# Patient Record
Sex: Male | Born: 1960
Health system: Southern US, Community
[De-identification: ages and names within clinical notes are randomized; demographics above are authoritative.]

---

## 2000-10-13 ENCOUNTER — Ambulatory Visit (HOSPITAL_COMMUNITY): Admission: RE | Admit: 2000-10-13 | Discharge: 2000-10-13 | Payer: Self-pay | Admitting: Gastroenterology

## 2003-06-10 ENCOUNTER — Ambulatory Visit (HOSPITAL_COMMUNITY): Admission: RE | Admit: 2003-06-10 | Discharge: 2003-06-10 | Payer: Self-pay | Admitting: Neurology

## 2003-06-10 ENCOUNTER — Encounter: Payer: Self-pay | Admitting: Neurology

## 2004-03-19 ENCOUNTER — Ambulatory Visit (HOSPITAL_COMMUNITY): Admission: RE | Admit: 2004-03-19 | Discharge: 2004-03-19 | Payer: Self-pay | Admitting: Neurology

## 2014-01-08 ENCOUNTER — Telehealth: Payer: Self-pay | Admitting: Internal Medicine

## 2014-01-08 NOTE — Telephone Encounter (Signed)
Received request from Nurse fax box, documents faxed for surgical clearance. To: [facility]  Fax number: [phone] Attention: [id number]/[id number]

## 2016-11-11 DIAGNOSIS — K5730 Diverticulosis of large intestine without perforation or abscess without bleeding: Secondary | ICD-10-CM | POA: Diagnosis not present

## 2016-11-11 DIAGNOSIS — I444 Left anterior fascicular block: Secondary | ICD-10-CM | POA: Diagnosis not present

## 2016-11-11 DIAGNOSIS — Z658 Other specified problems related to psychosocial circumstances: Secondary | ICD-10-CM | POA: Diagnosis not present

## 2016-11-11 DIAGNOSIS — Z0000 Encounter for general adult medical examination without abnormal findings: Secondary | ICD-10-CM | POA: Diagnosis not present

## 2016-11-11 DIAGNOSIS — E785 Hyperlipidemia, unspecified: Secondary | ICD-10-CM | POA: Diagnosis not present

## 2016-11-11 DIAGNOSIS — R972 Elevated prostate specific antigen [PSA]: Secondary | ICD-10-CM | POA: Diagnosis not present

## 2016-11-11 MED FILL — PROPRANOLOL 20 MG TABLET: 20 | 30 days supply | Qty: 30 | Fill #0

## 2016-11-11 MED FILL — ROSUVASTATIN CALCIUM 10 MG: 10 | 90 days supply | Qty: 90 | Fill #0 | Status: TO

## 2016-11-23 DIAGNOSIS — H5213 Myopia, bilateral: Secondary | ICD-10-CM | POA: Diagnosis not present

## 2016-12-31 DIAGNOSIS — Z0000 Encounter for general adult medical examination without abnormal findings: Secondary | ICD-10-CM | POA: Diagnosis not present

## 2017-08-15 MED FILL — ROSUVASTATIN CALCIUM 10 MG: 10 | 90 days supply | Qty: 90 | Fill #0

## 2018-02-20 DIAGNOSIS — R972 Elevated prostate specific antigen [PSA]: Secondary | ICD-10-CM | POA: Diagnosis not present

## 2018-02-20 DIAGNOSIS — Z0000 Encounter for general adult medical examination without abnormal findings: Secondary | ICD-10-CM | POA: Diagnosis not present

## 2018-02-20 DIAGNOSIS — Z658 Other specified problems related to psychosocial circumstances: Secondary | ICD-10-CM | POA: Diagnosis not present

## 2018-02-20 DIAGNOSIS — E785 Hyperlipidemia, unspecified: Secondary | ICD-10-CM | POA: Diagnosis not present

## 2018-02-20 DIAGNOSIS — I444 Left anterior fascicular block: Secondary | ICD-10-CM | POA: Diagnosis not present

## 2018-02-20 DIAGNOSIS — K5730 Diverticulosis of large intestine without perforation or abscess without bleeding: Secondary | ICD-10-CM | POA: Diagnosis not present

## 2018-02-20 MED FILL — ROSUVASTATIN CALCIUM 10 MG: 10 | 84 days supply | Qty: 36 | Fill #0

## 2018-02-20 MED FILL — PROPRANOLOL 20 MG TABLET: 20 | 30 days supply | Qty: 30 | Fill #0

## 2018-03-01 MED FILL — predniSONE 20 MG TABS: 20 | 6 days supply | Qty: 12 | Fill #0

## 2018-03-01 MED FILL — CYCLOBENZAPRINE 10 MG TAB: 10 | 5 days supply | Qty: 5 | Fill #0

## 2018-06-26 MED FILL — ROSUVASTATIN CALCIUM 10 MG: 10 | 84 days supply | Qty: 36 | Fill #1

## 2018-06-29 DIAGNOSIS — H52203 Unspecified astigmatism, bilateral: Secondary | ICD-10-CM | POA: Diagnosis not present

## 2018-06-29 DIAGNOSIS — H5213 Myopia, bilateral: Secondary | ICD-10-CM | POA: Diagnosis not present

## 2018-10-13 MED FILL — ROSUVASTATIN CALCIUM 10 MG: 10 | 84 days supply | Qty: 36 | Fill #2

## 2018-12-08 MED FILL — OSELTAMIVIR PHOSPHATE 75 MG: 75 | 5 days supply | Qty: 10 | Fill #0

## 2019-01-22 MED FILL — ROSUVASTATIN CALCIUM 10 MG: 10 | 84 days supply | Qty: 36 | Fill #3

## 2019-03-06 DIAGNOSIS — E785 Hyperlipidemia, unspecified: Secondary | ICD-10-CM | POA: Diagnosis not present

## 2019-03-06 DIAGNOSIS — Z0000 Encounter for general adult medical examination without abnormal findings: Secondary | ICD-10-CM | POA: Diagnosis not present

## 2019-03-06 DIAGNOSIS — K5730 Diverticulosis of large intestine without perforation or abscess without bleeding: Secondary | ICD-10-CM | POA: Diagnosis not present

## 2019-03-06 DIAGNOSIS — I444 Left anterior fascicular block: Secondary | ICD-10-CM | POA: Diagnosis not present

## 2019-03-06 DIAGNOSIS — R972 Elevated prostate specific antigen [PSA]: Secondary | ICD-10-CM | POA: Diagnosis not present

## 2019-03-06 DIAGNOSIS — Z658 Other specified problems related to psychosocial circumstances: Secondary | ICD-10-CM | POA: Diagnosis not present

## 2019-04-06 DIAGNOSIS — K5730 Diverticulosis of large intestine without perforation or abscess without bleeding: Secondary | ICD-10-CM | POA: Diagnosis not present

## 2019-04-06 DIAGNOSIS — R972 Elevated prostate specific antigen [PSA]: Secondary | ICD-10-CM | POA: Diagnosis not present

## 2019-04-06 DIAGNOSIS — E785 Hyperlipidemia, unspecified: Secondary | ICD-10-CM | POA: Diagnosis not present

## 2019-04-06 DIAGNOSIS — E559 Vitamin D deficiency, unspecified: Secondary | ICD-10-CM | POA: Diagnosis not present

## 2019-04-26 DIAGNOSIS — N401 Enlarged prostate with lower urinary tract symptoms: Secondary | ICD-10-CM | POA: Diagnosis not present

## 2019-04-26 DIAGNOSIS — R972 Elevated prostate specific antigen [PSA]: Secondary | ICD-10-CM | POA: Diagnosis not present

## 2019-04-26 DIAGNOSIS — R350 Frequency of micturition: Secondary | ICD-10-CM | POA: Diagnosis not present

## 2019-04-30 ENCOUNTER — Other Ambulatory Visit: Payer: Self-pay | Admitting: Urology

## 2019-04-30 DIAGNOSIS — R972 Elevated prostate specific antigen [PSA]: Secondary | ICD-10-CM

## 2019-05-10 MED FILL — ROSUVASTATIN CALCIUM 10 MG: 10 | 84 days supply | Qty: 36 | Fill #0

## 2019-06-01 ENCOUNTER — Ambulatory Visit
Admission: RE | Admit: 2019-06-01 | Discharge: 2019-06-01 | Disposition: A | Discharge status: Home / Self Care | Payer: 59 | Source: Ambulatory Visit | Attending: Urology | Admitting: Urology

## 2019-06-01 ENCOUNTER — Other Ambulatory Visit: Payer: Self-pay

## 2019-06-01 DIAGNOSIS — N400 Enlarged prostate without lower urinary tract symptoms: Secondary | ICD-10-CM | POA: Diagnosis not present

## 2019-06-01 DIAGNOSIS — R972 Elevated prostate specific antigen [PSA]: Secondary | ICD-10-CM

## 2019-06-01 MED ORDER — GADOBENATE DIMEGLUMINE 529 MG/ML IV SOLN
19.0000 mL | Freq: Once | INTRAVENOUS | Status: AC | PRN
  Administered 2019-06-01: 19 mL via INTRAVENOUS

## 2019-08-06 MED FILL — ROSUVASTATIN CALCIUM 10 MG: 10 | 84 days supply | Qty: 36 | Fill #1

## 2019-08-06 MED FILL — PROPRANOLOL 20 MG TABLET: 20 | 30 days supply | Qty: 30 | Fill #0

## 2019-10-23 ENCOUNTER — Other Ambulatory Visit: Payer: Self-pay | Admitting: Gastroenterology

## 2019-10-23 DIAGNOSIS — R634 Abnormal weight loss: Secondary | ICD-10-CM | POA: Diagnosis not present

## 2019-10-23 DIAGNOSIS — R1084 Generalized abdominal pain: Secondary | ICD-10-CM

## 2019-10-26 ENCOUNTER — Other Ambulatory Visit: Payer: Self-pay

## 2019-10-26 ENCOUNTER — Ambulatory Visit
Admission: RE | Admit: 2019-10-26 | Discharge: 2019-10-26 | Disposition: A | Discharge status: Home / Self Care | Payer: 59 | Source: Ambulatory Visit | Attending: Gastroenterology | Admitting: Gastroenterology

## 2019-10-26 DIAGNOSIS — R634 Abnormal weight loss: Secondary | ICD-10-CM

## 2019-10-26 DIAGNOSIS — N289 Disorder of kidney and ureter, unspecified: Secondary | ICD-10-CM | POA: Diagnosis not present

## 2019-10-26 DIAGNOSIS — R1084 Generalized abdominal pain: Secondary | ICD-10-CM

## 2019-10-26 MED ORDER — IOPAMIDOL (ISOVUE-300) INJECTION 61%
100.0000 mL | Freq: Once | INTRAVENOUS | Status: AC | PRN
  Administered 2019-10-26: 100 mL via INTRAVENOUS

## 2019-10-29 MED FILL — OSCIMIN SR 0.375 MG TABLET: 0.375 | 10 days supply | Qty: 20 | Fill #0

## 2019-10-29 MED FILL — ROSUVASTATIN CALCIUM 10 MG: 10 | 84 days supply | Qty: 36 | Fill #2

## 2019-11-21 MED FILL — ROSUVASTATIN CALCIUM 10 MG: 10 | 84 days supply | Qty: 36 | Fill #2

## 2019-11-21 MED FILL — OSCIMIN SR 0.375 MG TABLET: 0.375 | 10 days supply | Qty: 20 | Fill #0

## 2020-03-06 DIAGNOSIS — E785 Hyperlipidemia, unspecified: Secondary | ICD-10-CM | POA: Diagnosis not present

## 2020-03-06 DIAGNOSIS — R634 Abnormal weight loss: Secondary | ICD-10-CM | POA: Diagnosis not present

## 2020-03-06 DIAGNOSIS — Z0000 Encounter for general adult medical examination without abnormal findings: Secondary | ICD-10-CM | POA: Diagnosis not present

## 2020-03-06 DIAGNOSIS — R1084 Generalized abdominal pain: Secondary | ICD-10-CM | POA: Diagnosis not present

## 2020-03-06 DIAGNOSIS — I444 Left anterior fascicular block: Secondary | ICD-10-CM | POA: Diagnosis not present

## 2020-03-06 DIAGNOSIS — Z658 Other specified problems related to psychosocial circumstances: Secondary | ICD-10-CM | POA: Diagnosis not present

## 2020-03-06 DIAGNOSIS — K5730 Diverticulosis of large intestine without perforation or abscess without bleeding: Secondary | ICD-10-CM | POA: Diagnosis not present

## 2020-03-06 DIAGNOSIS — R972 Elevated prostate specific antigen [PSA]: Secondary | ICD-10-CM | POA: Diagnosis not present

## 2020-04-25 ENCOUNTER — Other Ambulatory Visit (HOSPITAL_COMMUNITY): Payer: Self-pay | Admitting: Internal Medicine

## 2020-04-25 MED FILL — ROSUVASTATIN CALCIUM 5 MG T: 5 | 84 days supply | Qty: 36 | Fill #0

## 2020-07-03 DIAGNOSIS — H5213 Myopia, bilateral: Secondary | ICD-10-CM | POA: Diagnosis not present

## 2020-07-03 MED FILL — ROSUVASTATIN CALCIUM 5 MG T: 5 | 84 days supply | Qty: 36 | Fill #1

## 2020-09-01 ENCOUNTER — Other Ambulatory Visit (HOSPITAL_COMMUNITY): Payer: Self-pay | Admitting: Internal Medicine

## 2020-09-01 MED FILL — PROPRANOLOL 20 MG TABLET: 20 | 90 days supply | Qty: 90 | Fill #0

## 2020-09-08 DIAGNOSIS — Z20822 Contact with and (suspected) exposure to covid-19: Secondary | ICD-10-CM | POA: Diagnosis not present

## 2020-10-03 MED FILL — PROPRANOLOL 20 MG TABLET: 20 | 90 days supply | Qty: 90 | Fill #0

## 2020-10-03 MED FILL — ROSUVASTATIN CALCIUM 5 MG T: 5 | 84 days supply | Qty: 36 | Fill #2

## 2021-02-09 ENCOUNTER — Other Ambulatory Visit (HOSPITAL_COMMUNITY): Payer: Self-pay

## 2021-02-11 ENCOUNTER — Other Ambulatory Visit (HOSPITAL_COMMUNITY): Payer: Self-pay

## 2021-02-11 MED ORDER — SCOPOLAMINE 1 MG/3DAYS TD PT72
1.5000 mg | MEDICATED_PATCH | TRANSDERMAL | 0 refills | Status: AC
  Filled 2021-02-11: qty 10, 30d supply, fill #0

## 2021-02-11 MED FILL — Rosuvastatin Calcium Tab 5 MG: ORAL | 87 days supply | Qty: 37 | Fill #0 | Status: AC

## 2021-04-02 ENCOUNTER — Other Ambulatory Visit (HOSPITAL_COMMUNITY): Payer: Self-pay

## 2021-04-02 ENCOUNTER — Other Ambulatory Visit: Payer: Self-pay | Admitting: Internal Medicine

## 2021-04-02 DIAGNOSIS — Z136 Encounter for screening for cardiovascular disorders: Secondary | ICD-10-CM

## 2021-04-02 MED ORDER — ROSUVASTATIN CALCIUM 5 MG PO TABS
5.0000 mg | ORAL_TABLET | ORAL | 3 refills | Status: DC
  Filled 2021-04-02: qty 13, 31d supply, fill #0
  Filled 2021-05-13: qty 13, 31d supply, fill #1
  Filled 2021-06-10: qty 13, 31d supply, fill #2
  Filled 2021-07-16: qty 13, 31d supply, fill #3
  Filled 2021-09-14: qty 13, 31d supply, fill #4
  Filled 2021-10-13: qty 13, 31d supply, fill #5
  Filled 2021-11-12: qty 13, 31d supply, fill #6
  Filled 2021-12-14: qty 13, 31d supply, fill #7
  Filled 2022-01-13: qty 13, 31d supply, fill #8
  Filled 2022-02-10: qty 13, 31d supply, fill #9
  Filled 2022-03-12: qty 13, 31d supply, fill #10

## 2021-04-02 MED ORDER — VALSARTAN-HYDROCHLOROTHIAZIDE 80-12.5 MG PO TABS
1.0000 | ORAL_TABLET | Freq: Every day | ORAL | 3 refills | Status: DC
  Filled 2021-04-02: qty 30, 30d supply, fill #0
  Filled 2021-05-09: qty 30, 30d supply, fill #1
  Filled 2021-06-10: qty 30, 30d supply, fill #2
  Filled 2021-07-16: qty 30, 30d supply, fill #3
  Filled 2021-08-18: qty 30, 30d supply, fill #4
  Filled 2021-09-14: qty 30, 30d supply, fill #5
  Filled 2021-10-13: qty 30, 30d supply, fill #6
  Filled 2021-11-12: qty 30, 30d supply, fill #7
  Filled 2021-12-14: qty 30, 30d supply, fill #8
  Filled 2022-01-13: qty 30, 30d supply, fill #9
  Filled 2022-02-10: qty 30, 30d supply, fill #10
  Filled 2022-03-12: qty 30, 30d supply, fill #11

## 2021-04-03 ENCOUNTER — Other Ambulatory Visit (HOSPITAL_COMMUNITY): Payer: Self-pay

## 2021-04-30 ENCOUNTER — Other Ambulatory Visit: Payer: Self-pay

## 2021-04-30 ENCOUNTER — Ambulatory Visit
Admission: RE | Admit: 2021-04-30 | Discharge: 2021-04-30 | Disposition: A | Discharge status: Home / Self Care | Payer: No Typology Code available for payment source | Source: Ambulatory Visit | Attending: Internal Medicine | Admitting: Internal Medicine

## 2021-04-30 DIAGNOSIS — Z136 Encounter for screening for cardiovascular disorders: Secondary | ICD-10-CM

## 2021-05-12 ENCOUNTER — Other Ambulatory Visit (HOSPITAL_COMMUNITY): Payer: Self-pay

## 2021-05-13 ENCOUNTER — Other Ambulatory Visit (HOSPITAL_COMMUNITY): Payer: Self-pay

## 2021-06-10 ENCOUNTER — Other Ambulatory Visit (HOSPITAL_COMMUNITY): Payer: Self-pay

## 2021-07-16 ENCOUNTER — Other Ambulatory Visit (HOSPITAL_COMMUNITY): Payer: Self-pay

## 2021-08-18 ENCOUNTER — Other Ambulatory Visit (HOSPITAL_COMMUNITY): Payer: Self-pay

## 2021-08-31 IMAGING — CT CT ABD-PELV W/ CM
2 of 5 series · 13 of 46 positions shown, 15 images · IV contrast (iopamidol)
Comparison: None.

CLINICAL DATA: Loss of weight, diarrhea and pain

EXAM:
CT ABDOMEN AND PELVIS WITH CONTRAST
TECHNIQUE: Multidetector CT imaging of the abdomen and pelvis was performed
using the standard protocol following bolus administration of
intravenous contrast.
CONTRAST:  100mL 9XJNIC-[id number] IOPAMIDOL ([id number]-[id number]) INJECTION 61%

[Series 2: abd pelvis 5.00 br40 s3 axial · axial · 0.62mm/px · z∈[+1113,+1628]mm · 10 of 115 slices shown, 12 images]
[im 6/115  soft-tissue]
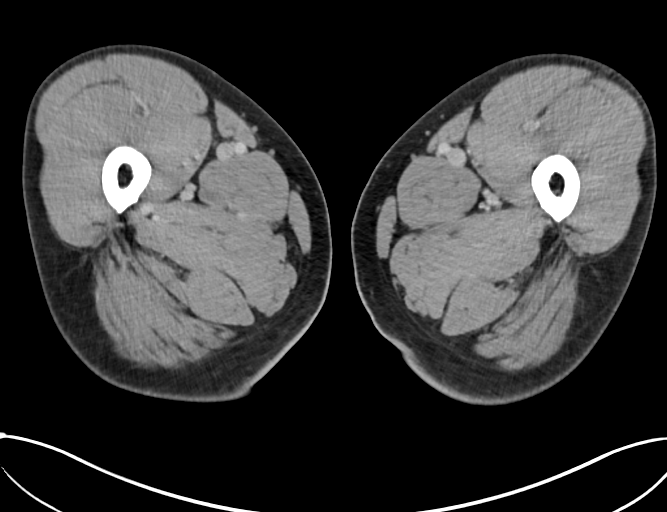
[im 6/115  bone]
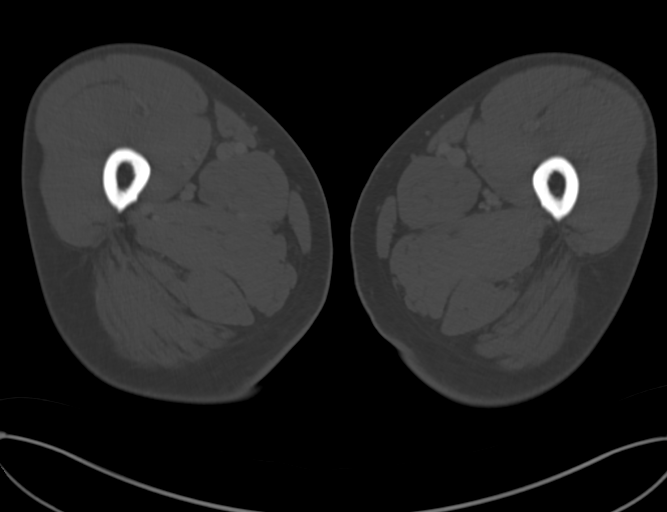
[im 18/115  soft-tissue]
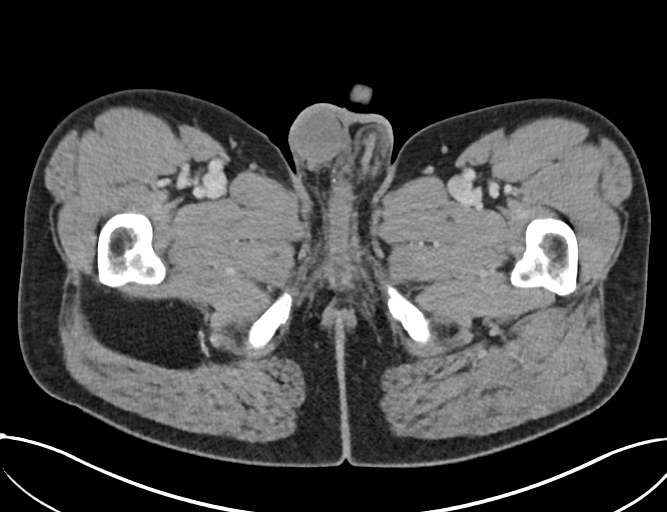
[im 29/115  soft-tissue]
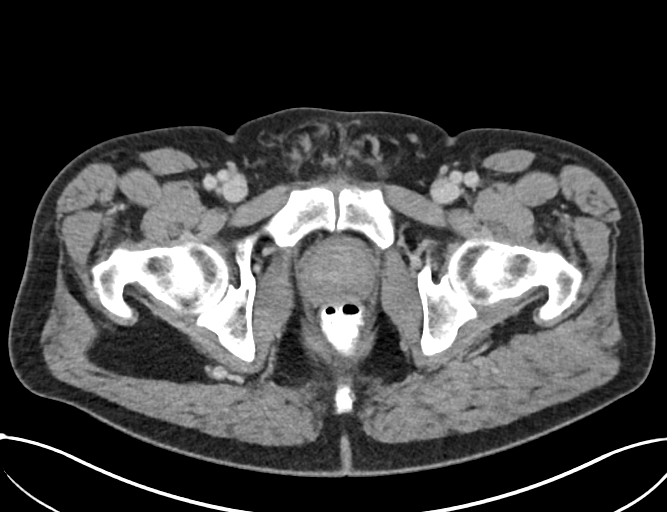
[im 40/115  soft-tissue]
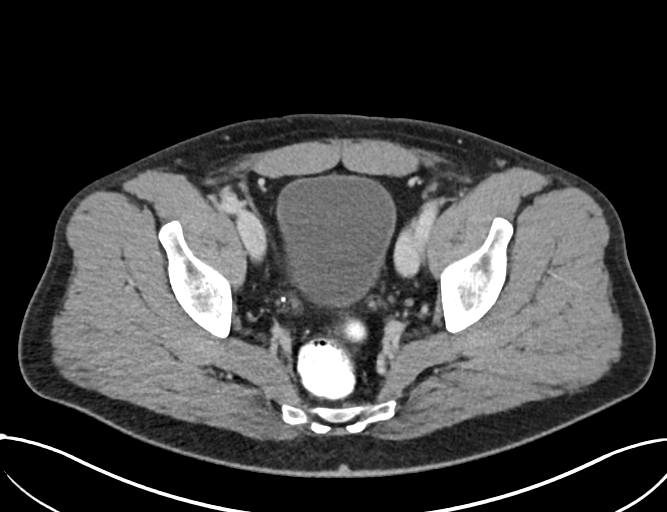
[im 52/115  soft-tissue]
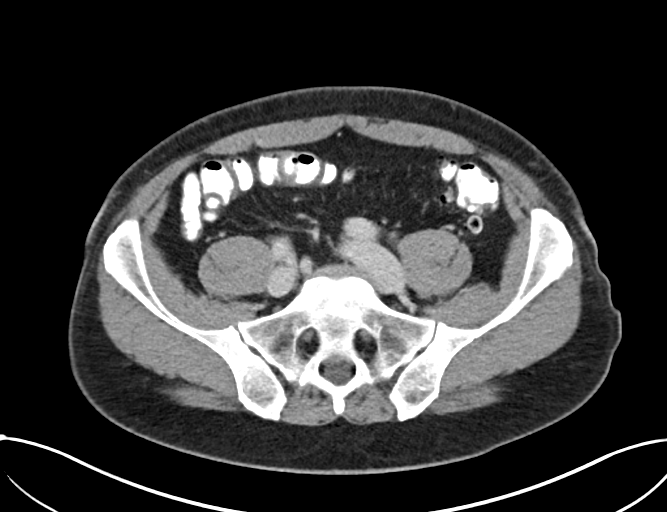
[im 63/115  soft-tissue]
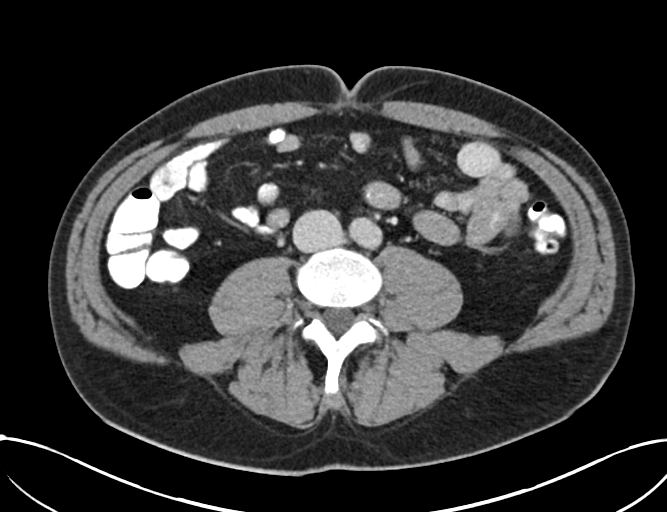
[im 75/115  soft-tissue]
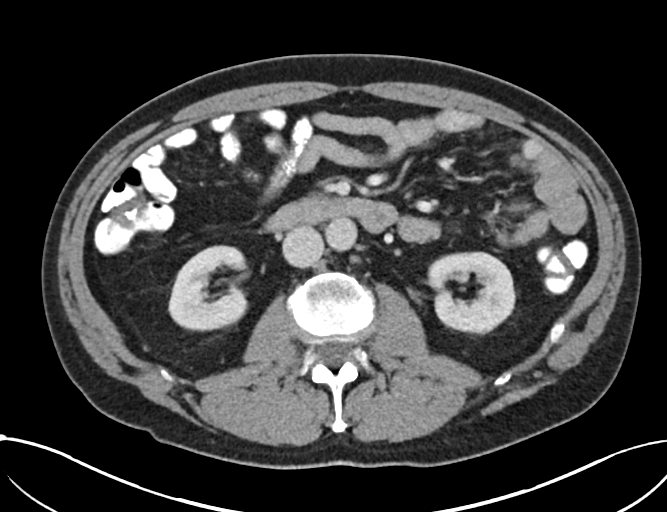
[im 86/115  soft-tissue]
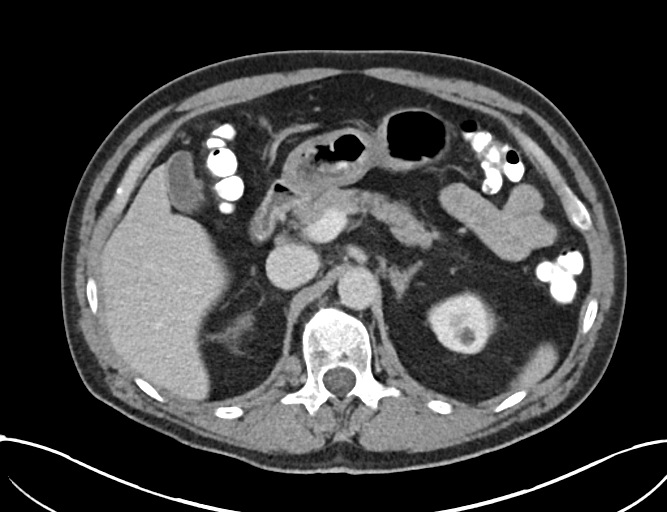
[im 97/115  soft-tissue]
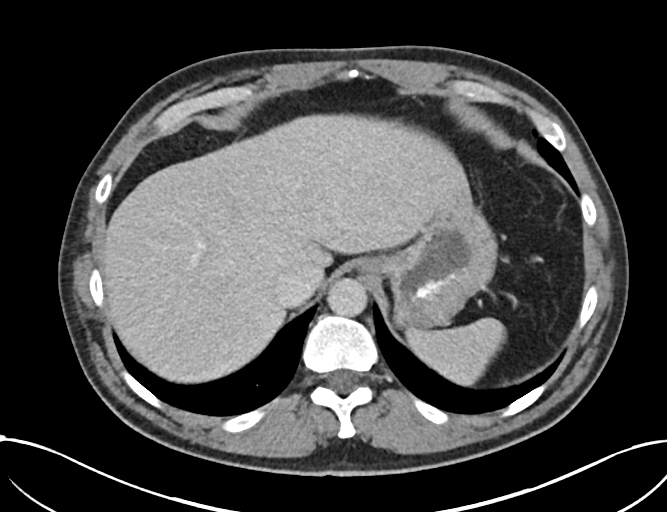
[im 97/115  bone]
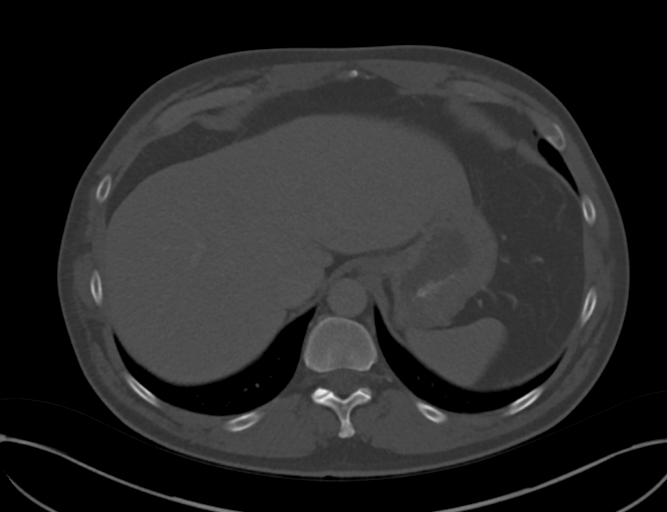
[im 109/115  soft-tissue]
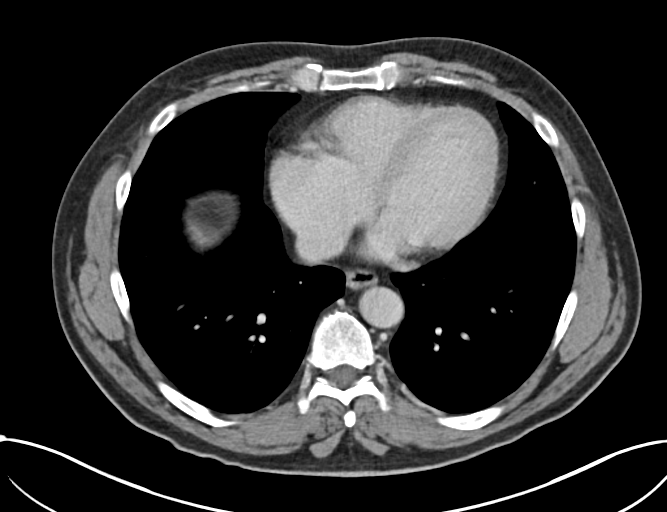

[Series 6: abd pelvis 2.00 br40 s3 cor · coronal · 0.81mm/px · 3 of 159 slices shown]
[im 53/159  soft-tissue]
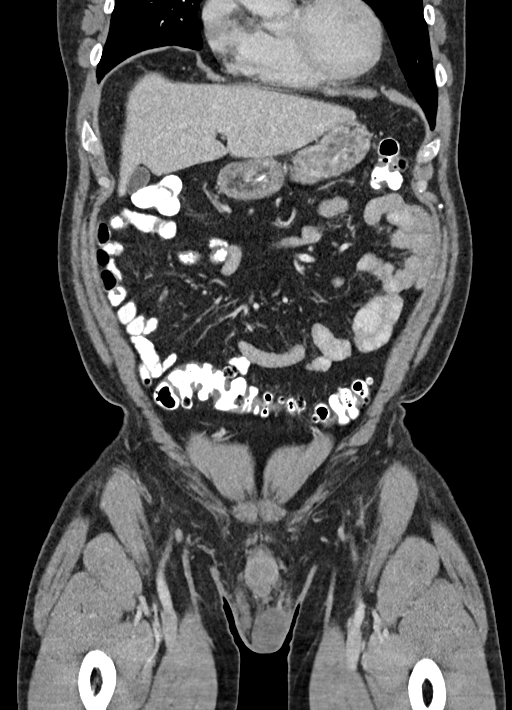
[im 71/159  soft-tissue]
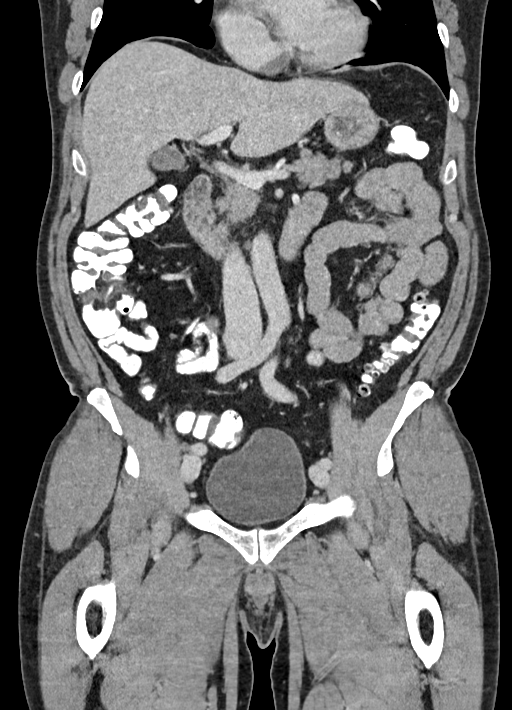
[im 88/159  soft-tissue]
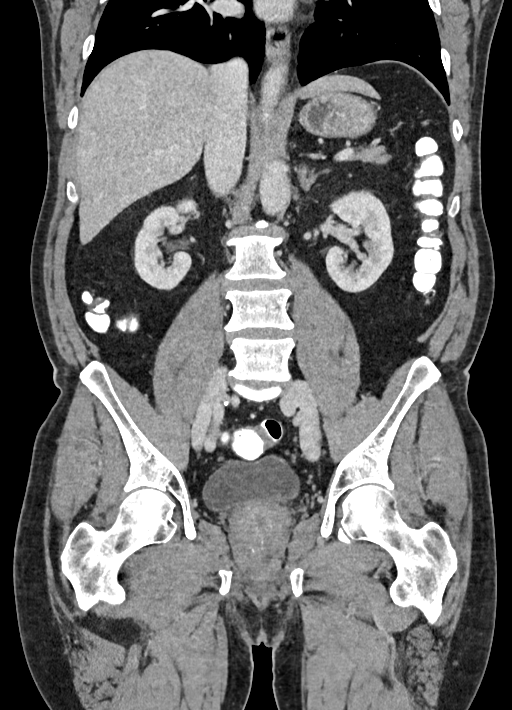

[13 of 46 positions shown; findings below may reference images not displayed]

FINDINGS: Lower chest: No acute abnormality.

Hepatobiliary: No focal liver abnormality is seen. No gallstones,
gallbladder wall thickening, or biliary dilatation.

Pancreas: Unremarkable. No pancreatic ductal dilatation or
surrounding inflammatory changes.

Spleen: Normal in size without focal abnormality.

Adrenals/Urinary Tract: Normal appearance of the adrenal glands.
Left upper pole low-attenuation structure measures 0.8 cm, image
[DATE]. This is too small to reliably characterize. No hydronephrosis
identified bilaterally. Urinary bladder is unremarkable.

Stomach/Bowel: Stomach is normal. There is no bowel wall thickening,
inflammation or distension. The appendix is visualized and appears
normal. Scattered colonic diverticula noted. No acute inflammation
identified however

Vascular/Lymphatic: No significant vascular findings are present. No
enlarged abdominal or pelvic lymph nodes.

Reproductive: Prostate measures 4.1 x 4.8 by 4.7 cm.

Other: No abdominal wall hernia or abnormality. No abdominopelvic
ascites.

Musculoskeletal: No acute or suspicious osseous findings. There is
degenerative disc disease identified at L1-2 and L2-3 with disc
space narrowing and endplate spurring.
IMPRESSION: 1. No acute findings and no explanation for patient's pain and loss
of weight.
2. Small left upper pole kidney lesion measures 8 mm and is too
small to characterize.
3. Lumbar degenerative disc disease.

## 2021-09-14 ENCOUNTER — Other Ambulatory Visit (HOSPITAL_COMMUNITY): Payer: Self-pay

## 2021-10-13 ENCOUNTER — Other Ambulatory Visit (HOSPITAL_COMMUNITY): Payer: Self-pay

## 2021-11-12 ENCOUNTER — Other Ambulatory Visit (HOSPITAL_COMMUNITY): Payer: Self-pay

## 2021-12-14 ENCOUNTER — Other Ambulatory Visit (HOSPITAL_COMMUNITY): Payer: Self-pay

## 2021-12-14 MED ORDER — PEG 3350-KCL-NA BICARB-NACL 420 G PO SOLR
ORAL | 0 refills | Status: AC
  Filled 2021-12-14: qty 4000, 1d supply, fill #0

## 2022-01-13 ENCOUNTER — Other Ambulatory Visit (HOSPITAL_COMMUNITY): Payer: Self-pay

## 2022-02-10 ENCOUNTER — Other Ambulatory Visit (HOSPITAL_COMMUNITY): Payer: Self-pay

## 2022-02-11 ENCOUNTER — Other Ambulatory Visit (HOSPITAL_COMMUNITY): Payer: Self-pay

## 2022-03-12 ENCOUNTER — Other Ambulatory Visit (HOSPITAL_COMMUNITY): Payer: Self-pay

## 2022-04-09 ENCOUNTER — Other Ambulatory Visit (HOSPITAL_COMMUNITY): Payer: Self-pay

## 2022-04-09 MED ORDER — ROSUVASTATIN CALCIUM 5 MG PO TABS
5.0000 mg | ORAL_TABLET | ORAL | 3 refills | Status: AC
  Filled 2022-04-09: qty 13, 31d supply, fill #0
  Filled 2022-07-14: qty 13, 31d supply, fill #1
  Filled 2022-09-15: qty 13, 31d supply, fill #2
  Filled 2022-11-06: qty 13, 31d supply, fill #3
  Filled 2022-11-28 – 2022-12-25 (×2): qty 13, 31d supply, fill #4
  Filled 2023-02-01: qty 13, 31d supply, fill #5

## 2022-04-09 MED ORDER — VALSARTAN-HYDROCHLOROTHIAZIDE 80-12.5 MG PO TABS
1.0000 | ORAL_TABLET | Freq: Every day | ORAL | 3 refills | Status: AC
  Filled 2022-04-09: qty 30, 30d supply, fill #0
  Filled 2022-05-11: qty 30, 30d supply, fill #1
  Filled 2022-06-16: qty 30, 30d supply, fill #2
  Filled 2022-07-14: qty 30, 30d supply, fill #3
  Filled 2022-08-15: qty 30, 30d supply, fill #4
  Filled 2022-09-15: qty 30, 30d supply, fill #5
  Filled 2022-10-12: qty 30, 30d supply, fill #6

## 2022-04-09 MED ORDER — ROSUVASTATIN CALCIUM 5 MG PO TABS
5.0000 mg | ORAL_TABLET | ORAL | 3 refills | Status: DC
  Filled 2022-05-11: qty 13, 30d supply, fill #0
  Filled 2022-06-16: qty 13, 30d supply, fill #1
  Filled 2022-08-15: qty 13, 30d supply, fill #2
  Filled 2022-10-12: qty 13, 30d supply, fill #3
  Filled 2022-11-30: qty 13, 30d supply, fill #4
  Filled 2023-03-07: qty 13, 30d supply, fill #5
  Filled 2023-04-01: qty 13, 30d supply, fill #6

## 2022-05-12 ENCOUNTER — Other Ambulatory Visit (HOSPITAL_COMMUNITY): Payer: Self-pay

## 2022-06-16 ENCOUNTER — Other Ambulatory Visit (HOSPITAL_COMMUNITY): Payer: Self-pay

## 2022-06-17 ENCOUNTER — Other Ambulatory Visit (HOSPITAL_COMMUNITY): Payer: Self-pay

## 2022-06-17 MED ORDER — PROPRANOLOL HCL 20 MG PO TABS
20.0000 mg | ORAL_TABLET | Freq: Every day | ORAL | 1 refills | Status: AC
  Filled 2022-06-17: qty 30, 30d supply, fill #0

## 2022-06-22 ENCOUNTER — Other Ambulatory Visit (HOSPITAL_COMMUNITY): Payer: Self-pay

## 2022-07-15 ENCOUNTER — Other Ambulatory Visit (HOSPITAL_COMMUNITY): Payer: Self-pay

## 2022-08-16 ENCOUNTER — Other Ambulatory Visit (HOSPITAL_COMMUNITY): Payer: Self-pay

## 2022-09-15 ENCOUNTER — Other Ambulatory Visit (HOSPITAL_COMMUNITY): Payer: Self-pay

## 2022-09-24 ENCOUNTER — Other Ambulatory Visit (HOSPITAL_COMMUNITY): Payer: Self-pay

## 2022-09-24 MED ORDER — ALFUZOSIN HCL ER 10 MG PO TB24
10.0000 mg | ORAL_TABLET | Freq: Every day | ORAL | 3 refills | Status: AC
  Filled 2022-09-24: qty 30, 30d supply, fill #0
  Filled 2022-10-24: qty 30, 30d supply, fill #1
  Filled 2022-11-28: qty 30, 30d supply, fill #2
  Filled 2022-12-25 – 2022-12-27 (×2): qty 30, 30d supply, fill #3
  Filled 2023-02-01: qty 30, 30d supply, fill #4
  Filled 2023-03-07: qty 30, 30d supply, fill #5
  Filled 2023-04-01: qty 30, 30d supply, fill #6

## 2022-10-12 ENCOUNTER — Other Ambulatory Visit (HOSPITAL_COMMUNITY): Payer: Self-pay

## 2022-11-29 ENCOUNTER — Other Ambulatory Visit (HOSPITAL_COMMUNITY): Payer: Self-pay

## 2022-11-30 ENCOUNTER — Other Ambulatory Visit: Payer: Self-pay

## 2022-11-30 ENCOUNTER — Other Ambulatory Visit (HOSPITAL_COMMUNITY): Payer: Self-pay

## 2022-12-27 ENCOUNTER — Other Ambulatory Visit (HOSPITAL_COMMUNITY): Payer: Self-pay

## 2023-03-06 IMAGING — CT CT CARDIAC CORONARY ARTERY CALCIUM SCORE
3 series · 14 of 20 positions shown, 16 images · non-contrast
Comparison: None.

CLINICAL DATA: 60-year-old Caucasian male with borderline
hypercholesterolemia.

EXAM:
CT CARDIAC CORONARY ARTERY CALCIUM SCORE
TECHNIQUE: Non-contrast imaging through the heart was performed using
prospective ECG gating. Image post processing was performed on an
independent workstation, allowing for quantitative analysis of the
heart and coronary arteries. Note that this exam targets the heart
and the chest was not imaged in its entirety.

[Series 2: calcium scoring 2.00 qr36 bestdiast 71% hrt calciu · axial · 0.43mm/px · z∈[+1708,+1774]mm · 4 of 56 slices shown]
[im 12/56  vessel]
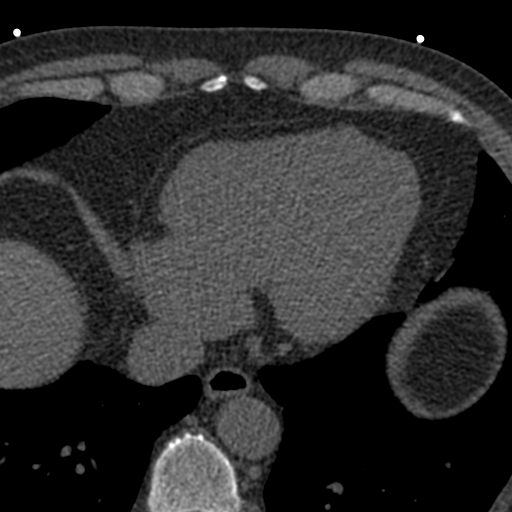
[im 23/56  vessel]
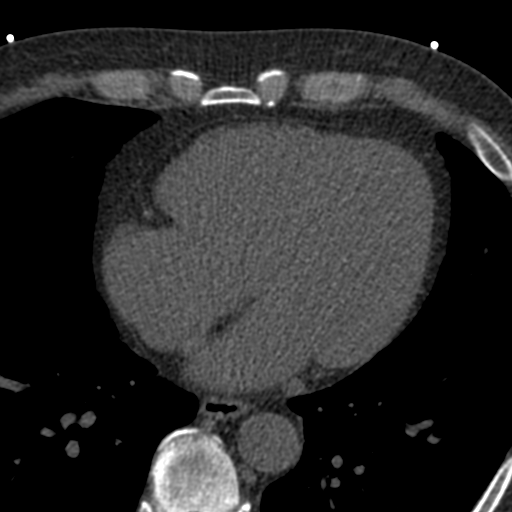
[im 34/56  vessel]
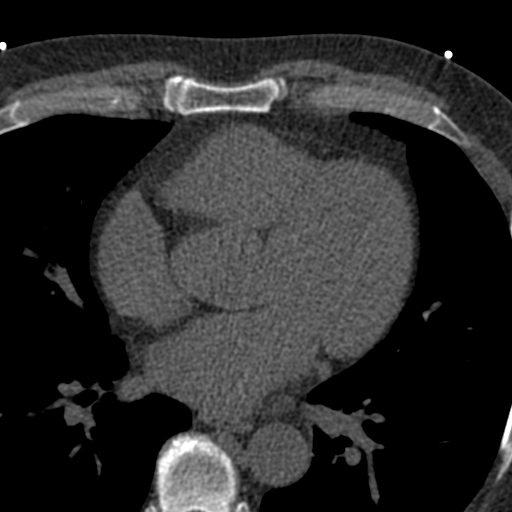
[im 45/56  vessel]
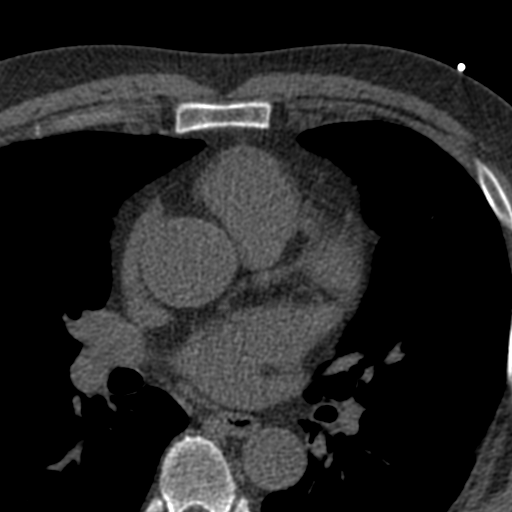

[Series 3: calcium scoring 2.00 br40 bestdiast 71% axial · axial · 0.59mm/px · z∈[+1704,+1776]mm · 5 of 56 slices shown, 7 images]
[im 10/56  vessel]
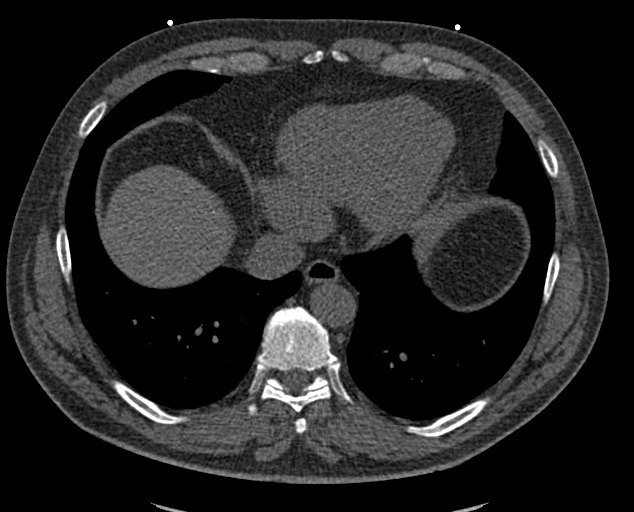
[im 10/56  lung]
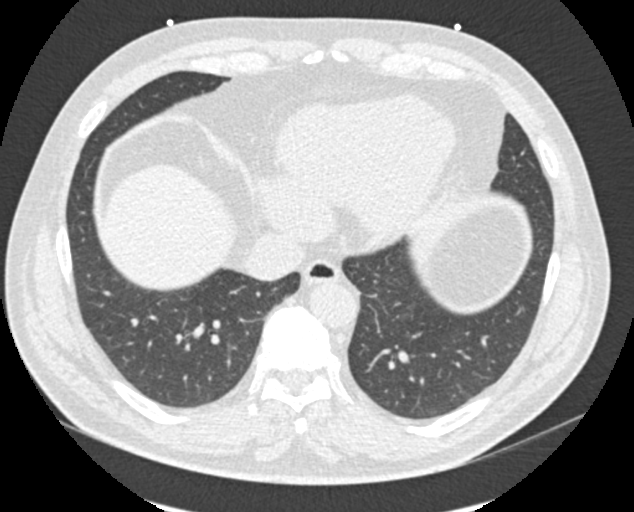
[im 19/56  vessel]
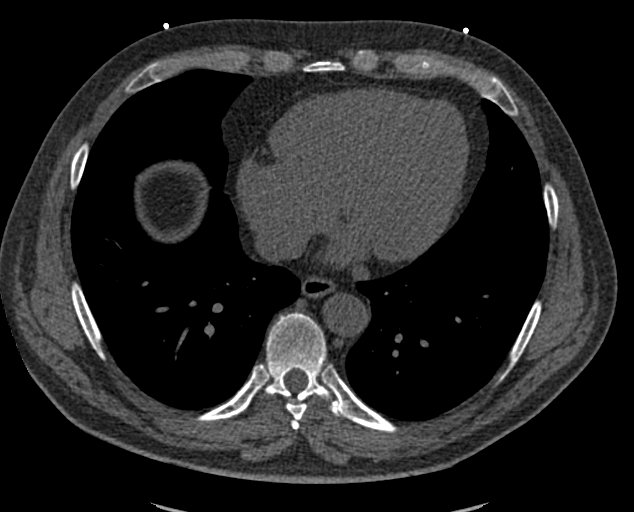
[im 28/56  vessel]
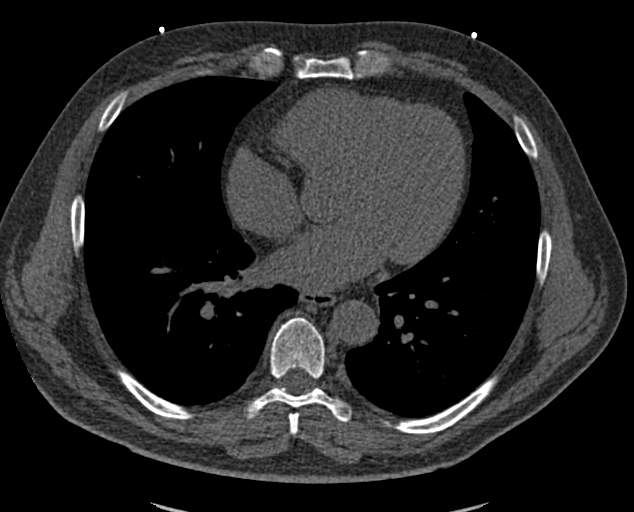
[im 37/56  vessel]
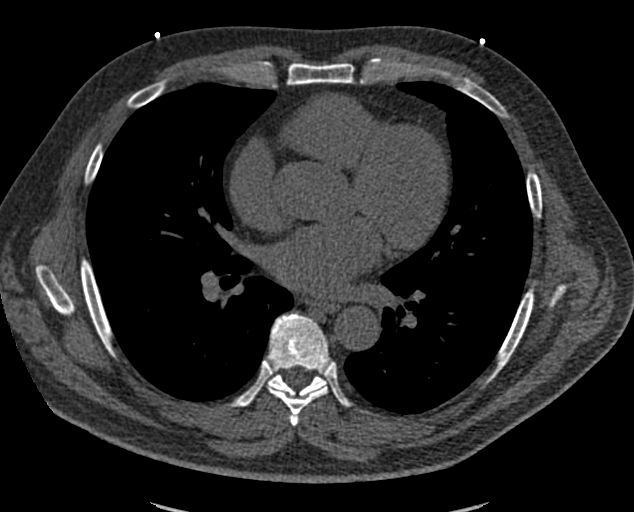
[im 46/56  vessel]
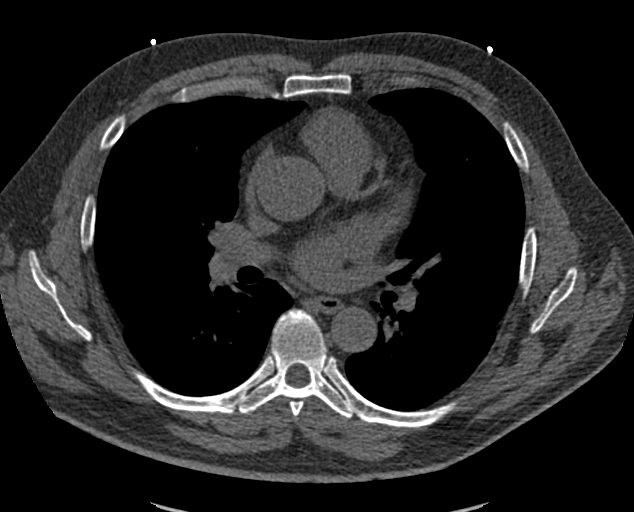
[im 46/56  lung]
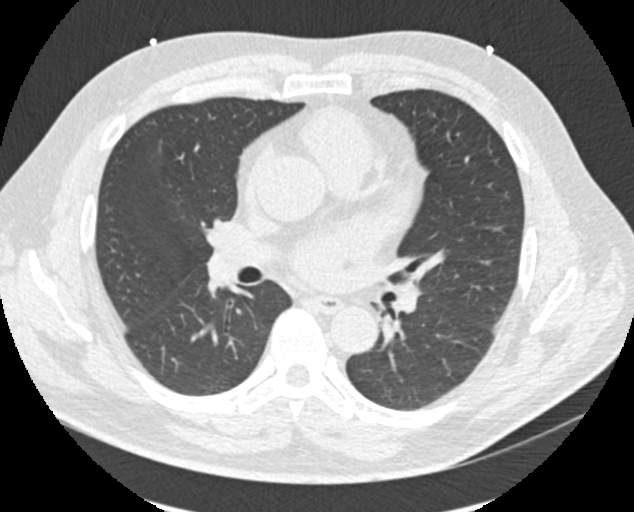

[Series 9: calcium scoring 2.00 br60 bestdiast 71% lungs · axial · 0.59mm/px · z∈[+1704,+1776]mm · 5 of 56 slices shown]
[im 10/56  vessel]
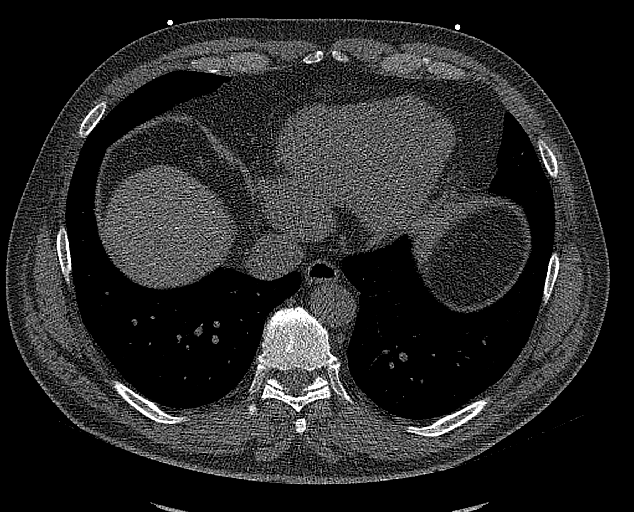
[im 19/56  vessel]
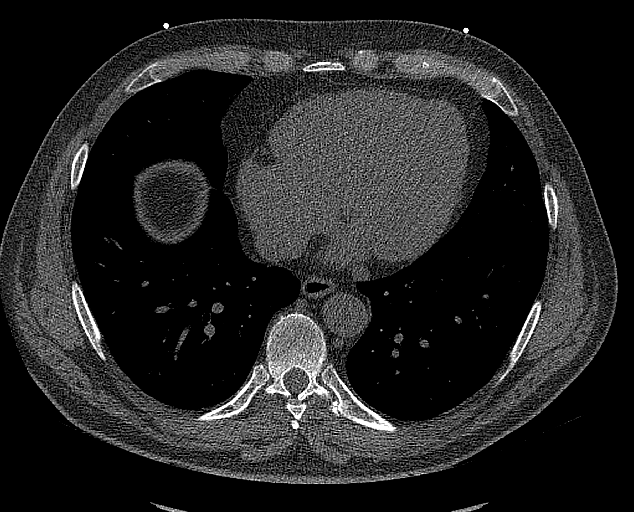
[im 28/56  vessel]
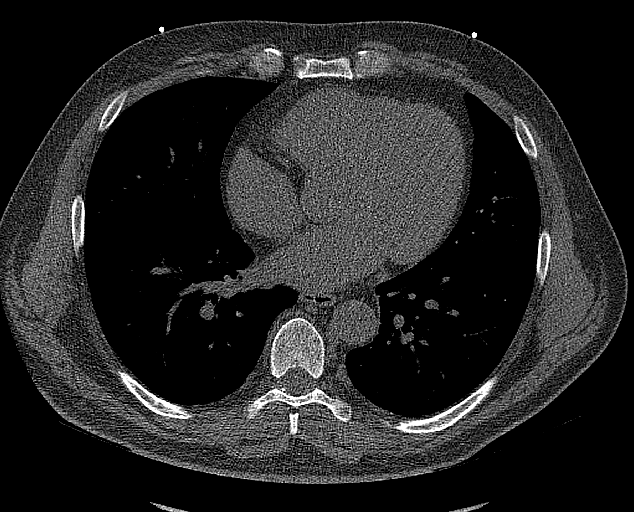
[im 37/56  vessel]
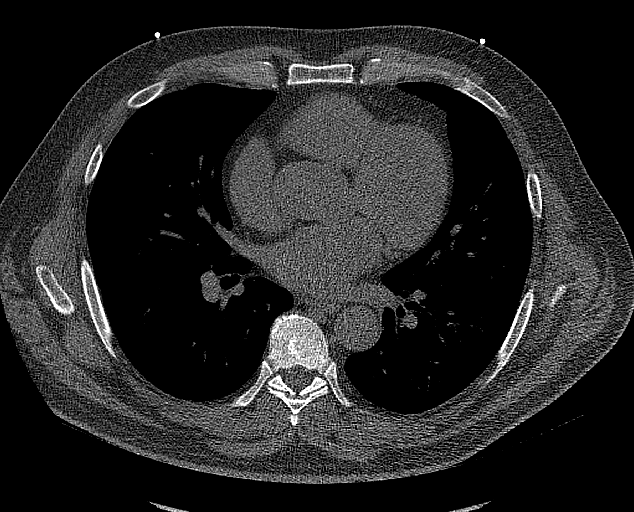
[im 46/56  vessel]
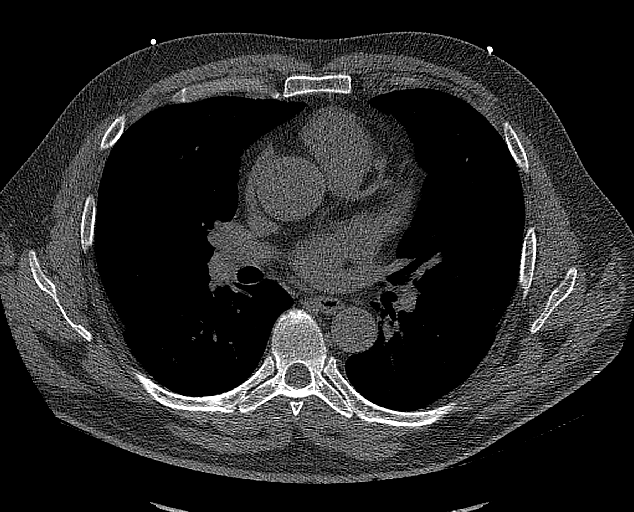

[14 of 20 positions shown; findings below may reference images not displayed]

FINDINGS: CORONARY CALCIUM SCORES:

Left Main:

LAD: 0

LCx: 0

RCA: 0

Total Agatston Score:

[HOSPITAL] percentile: 32

AORTA MEASUREMENTS:

Ascending Aorta: 38 mm

Descending Aorta: 26 mm

OTHER FINDINGS:

The heart size is within normal limits. No pericardial fluid is
identified. Visualized segments of the thoracic aorta and central
pulmonary arteries are normal in caliber. Visualized mediastinum and
hilar regions demonstrate no lymphadenopathy or masses. Visualized
lungs show no evidence of pulmonary edema, consolidation,
pneumothorax, nodule or pleural fluid. Visualized upper abdomen and
bony structures are unremarkable.
IMPRESSION: Coronary calcium score of 2.1 is at the 32nd percentile for the
patient's age, sex and race.

## 2023-04-05 ENCOUNTER — Other Ambulatory Visit (HOSPITAL_COMMUNITY): Payer: Self-pay

## 2023-04-22 ENCOUNTER — Other Ambulatory Visit (HOSPITAL_COMMUNITY): Payer: Self-pay

## 2023-04-22 MED ORDER — TADALAFIL 5 MG PO TABS
5.0000 mg | ORAL_TABLET | Freq: Every day | ORAL | 5 refills | Status: DC | PRN
  Filled 2023-04-22: qty 65, 65d supply, fill #0
  Filled 2023-04-22: qty 25, 25d supply, fill #0
  Filled 2023-07-21: qty 30, 30d supply, fill #1
  Filled 2023-08-24: qty 30, 30d supply, fill #0
  Filled 2023-09-17 – 2023-09-19 (×2): qty 30, 30d supply, fill #1
  Filled 2023-10-14: qty 30, 30d supply, fill #2
  Filled 2023-11-21: qty 30, 30d supply, fill #3
  Filled 2023-12-17: qty 30, 30d supply, fill #4
  Filled 2024-01-18: qty 30, 30d supply, fill #5
  Filled 2024-03-16: qty 30, 30d supply, fill #6
  Filled 2024-04-13: qty 30, 30d supply, fill #7

## 2023-04-25 ENCOUNTER — Other Ambulatory Visit (HOSPITAL_COMMUNITY): Payer: Self-pay

## 2023-04-25 MED ORDER — ROSUVASTATIN CALCIUM 5 MG PO TABS
5.0000 mg | ORAL_TABLET | Freq: Three times a day (TID) | ORAL | 3 refills | Status: DC
  Filled 2023-04-25: qty 12, 28d supply, fill #0
  Filled 2023-06-22: qty 12, 28d supply, fill #1
  Filled 2023-07-21: qty 12, 28d supply, fill #2
  Filled 2023-08-24: qty 12, 28d supply, fill #0
  Filled 2023-09-17 – 2023-09-19 (×2): qty 12, 28d supply, fill #1
  Filled 2023-10-14: qty 12, 28d supply, fill #2
  Filled 2023-11-21: qty 12, 28d supply, fill #3
  Filled 2023-12-17: qty 12, 28d supply, fill #4
  Filled 2024-01-18: qty 12, 28d supply, fill #5
  Filled 2024-02-16: qty 12, 28d supply, fill #6
  Filled 2024-03-16: qty 12, 28d supply, fill #7
  Filled 2024-04-13: qty 12, 28d supply, fill #8

## 2023-07-25 ENCOUNTER — Other Ambulatory Visit (HOSPITAL_COMMUNITY): Payer: Self-pay

## 2023-08-24 ENCOUNTER — Other Ambulatory Visit (HOSPITAL_COMMUNITY): Payer: Self-pay

## 2023-09-01 ENCOUNTER — Other Ambulatory Visit: Payer: Self-pay | Admitting: Urology

## 2023-09-01 DIAGNOSIS — R972 Elevated prostate specific antigen [PSA]: Secondary | ICD-10-CM

## 2023-09-19 ENCOUNTER — Other Ambulatory Visit (HOSPITAL_COMMUNITY): Payer: Self-pay

## 2023-09-20 ENCOUNTER — Other Ambulatory Visit (HOSPITAL_COMMUNITY): Payer: Self-pay

## 2023-09-20 MED ORDER — DOXYCYCLINE HYCLATE 100 MG PO TABS
200.0000 mg | ORAL_TABLET | ORAL | 0 refills | Status: AC
  Filled 2023-09-20: qty 2, 1d supply, fill #0

## 2023-09-27 DIAGNOSIS — D2271 Melanocytic nevi of right lower limb, including hip: Secondary | ICD-10-CM | POA: Diagnosis not present

## 2023-09-27 DIAGNOSIS — B353 Tinea pedis: Secondary | ICD-10-CM | POA: Diagnosis not present

## 2023-09-27 DIAGNOSIS — D225 Melanocytic nevi of trunk: Secondary | ICD-10-CM | POA: Diagnosis not present

## 2023-09-27 DIAGNOSIS — L814 Other melanin hyperpigmentation: Secondary | ICD-10-CM | POA: Diagnosis not present

## 2023-09-27 DIAGNOSIS — D2272 Melanocytic nevi of left lower limb, including hip: Secondary | ICD-10-CM | POA: Diagnosis not present

## 2023-09-27 DIAGNOSIS — D2261 Melanocytic nevi of right upper limb, including shoulder: Secondary | ICD-10-CM | POA: Diagnosis not present

## 2023-09-27 DIAGNOSIS — D2371 Other benign neoplasm of skin of right lower limb, including hip: Secondary | ICD-10-CM | POA: Diagnosis not present

## 2023-09-27 DIAGNOSIS — D1801 Hemangioma of skin and subcutaneous tissue: Secondary | ICD-10-CM | POA: Diagnosis not present

## 2023-09-27 DIAGNOSIS — D2262 Melanocytic nevi of left upper limb, including shoulder: Secondary | ICD-10-CM | POA: Diagnosis not present

## 2023-10-18 ENCOUNTER — Other Ambulatory Visit (HOSPITAL_COMMUNITY): Payer: Self-pay

## 2023-10-21 ENCOUNTER — Other Ambulatory Visit (HOSPITAL_COMMUNITY): Payer: Self-pay

## 2023-11-18 ENCOUNTER — Encounter: Payer: Self-pay | Admitting: Urology

## 2023-11-23 ENCOUNTER — Other Ambulatory Visit (HOSPITAL_COMMUNITY): Payer: Self-pay

## 2023-11-25 ENCOUNTER — Other Ambulatory Visit (HOSPITAL_COMMUNITY): Payer: Self-pay

## 2023-11-25 ENCOUNTER — Encounter (HOSPITAL_COMMUNITY): Payer: Self-pay

## 2023-12-01 ENCOUNTER — Ambulatory Visit
Admission: RE | Admit: 2023-12-01 | Discharge: 2023-12-01 | Disposition: A | Discharge status: Home / Self Care | Payer: 59 | Source: Ambulatory Visit | Attending: Urology | Admitting: Urology

## 2023-12-01 DIAGNOSIS — R972 Elevated prostate specific antigen [PSA]: Secondary | ICD-10-CM | POA: Diagnosis not present

## 2023-12-01 MED ORDER — GADOPICLENOL 0.5 MMOL/ML IV SOLN
10.0000 mL | Freq: Once | INTRAVENOUS | Status: AC | PRN
  Administered 2023-12-01: 10 mL via INTRAVENOUS

## 2023-12-21 ENCOUNTER — Other Ambulatory Visit: Payer: Self-pay

## 2024-01-23 DIAGNOSIS — R972 Elevated prostate specific antigen [PSA]: Secondary | ICD-10-CM | POA: Diagnosis not present

## 2024-01-23 DIAGNOSIS — R030 Elevated blood-pressure reading, without diagnosis of hypertension: Secondary | ICD-10-CM | POA: Diagnosis not present

## 2024-01-23 DIAGNOSIS — J302 Other seasonal allergic rhinitis: Secondary | ICD-10-CM | POA: Diagnosis not present

## 2024-01-23 DIAGNOSIS — M25511 Pain in right shoulder: Secondary | ICD-10-CM | POA: Diagnosis not present

## 2024-01-23 DIAGNOSIS — N401 Enlarged prostate with lower urinary tract symptoms: Secondary | ICD-10-CM | POA: Diagnosis not present

## 2024-01-23 DIAGNOSIS — M25512 Pain in left shoulder: Secondary | ICD-10-CM | POA: Diagnosis not present

## 2024-01-23 DIAGNOSIS — E785 Hyperlipidemia, unspecified: Secondary | ICD-10-CM | POA: Diagnosis not present

## 2024-01-23 DIAGNOSIS — R1013 Epigastric pain: Secondary | ICD-10-CM | POA: Diagnosis not present

## 2024-01-24 ENCOUNTER — Encounter (HOSPITAL_COMMUNITY): Payer: Self-pay

## 2024-01-24 ENCOUNTER — Other Ambulatory Visit (HOSPITAL_COMMUNITY): Payer: Self-pay

## 2024-02-21 ENCOUNTER — Other Ambulatory Visit (HOSPITAL_COMMUNITY): Payer: Self-pay

## 2024-03-19 ENCOUNTER — Other Ambulatory Visit (HOSPITAL_COMMUNITY): Payer: Self-pay

## 2024-03-21 ENCOUNTER — Other Ambulatory Visit (HOSPITAL_COMMUNITY): Payer: Self-pay

## 2024-03-23 ENCOUNTER — Other Ambulatory Visit (HOSPITAL_COMMUNITY): Payer: Self-pay

## 2024-04-13 ENCOUNTER — Other Ambulatory Visit (HOSPITAL_COMMUNITY): Payer: Self-pay

## 2024-05-14 ENCOUNTER — Other Ambulatory Visit (HOSPITAL_COMMUNITY): Payer: Self-pay

## 2024-05-14 MED ORDER — ROSUVASTATIN CALCIUM 5 MG PO TABS
5.0000 mg | ORAL_TABLET | Freq: Every day | ORAL | 3 refills | Status: AC
  Filled 2024-05-14: qty 30, 30d supply, fill #0
  Filled 2024-06-13: qty 30, 30d supply, fill #1
  Filled 2024-07-09: qty 30, 30d supply, fill #2
  Filled 2024-08-09: qty 30, 30d supply, fill #3
  Filled 2024-09-18: qty 30, 30d supply, fill #4
  Filled 2024-10-14: qty 30, 30d supply, fill #5
  Filled 2024-11-19 – 2024-11-20 (×2): qty 30, 30d supply, fill #6

## 2024-05-14 MED ORDER — TADALAFIL 5 MG PO TABS
5.0000 mg | ORAL_TABLET | Freq: Every day | ORAL | 0 refills | Status: DC | PRN
  Filled 2024-05-14: qty 30, 30d supply, fill #0

## 2024-06-13 ENCOUNTER — Other Ambulatory Visit (HOSPITAL_COMMUNITY): Payer: Self-pay

## 2024-06-13 MED ORDER — TADALAFIL 5 MG PO TABS
5.0000 mg | ORAL_TABLET | Freq: Every day | ORAL | 0 refills | Status: DC
  Filled 2024-06-13: qty 30, 30d supply, fill #0

## 2024-06-26 DIAGNOSIS — E785 Hyperlipidemia, unspecified: Secondary | ICD-10-CM | POA: Diagnosis not present

## 2024-06-26 DIAGNOSIS — Z0000 Encounter for general adult medical examination without abnormal findings: Secondary | ICD-10-CM | POA: Diagnosis not present

## 2024-06-26 DIAGNOSIS — R972 Elevated prostate specific antigen [PSA]: Secondary | ICD-10-CM | POA: Diagnosis not present

## 2024-07-02 DIAGNOSIS — J302 Other seasonal allergic rhinitis: Secondary | ICD-10-CM | POA: Diagnosis not present

## 2024-07-02 DIAGNOSIS — R1013 Epigastric pain: Secondary | ICD-10-CM | POA: Diagnosis not present

## 2024-07-02 DIAGNOSIS — M25512 Pain in left shoulder: Secondary | ICD-10-CM | POA: Diagnosis not present

## 2024-07-02 DIAGNOSIS — R82998 Other abnormal findings in urine: Secondary | ICD-10-CM | POA: Diagnosis not present

## 2024-07-02 DIAGNOSIS — Z0000 Encounter for general adult medical examination without abnormal findings: Secondary | ICD-10-CM | POA: Diagnosis not present

## 2024-07-02 DIAGNOSIS — R972 Elevated prostate specific antigen [PSA]: Secondary | ICD-10-CM | POA: Diagnosis not present

## 2024-07-02 DIAGNOSIS — Z1331 Encounter for screening for depression: Secondary | ICD-10-CM | POA: Diagnosis not present

## 2024-07-02 DIAGNOSIS — R030 Elevated blood-pressure reading, without diagnosis of hypertension: Secondary | ICD-10-CM | POA: Diagnosis not present

## 2024-07-02 DIAGNOSIS — N401 Enlarged prostate with lower urinary tract symptoms: Secondary | ICD-10-CM | POA: Diagnosis not present

## 2024-07-02 DIAGNOSIS — M25511 Pain in right shoulder: Secondary | ICD-10-CM | POA: Diagnosis not present

## 2024-07-02 DIAGNOSIS — R001 Bradycardia, unspecified: Secondary | ICD-10-CM | POA: Diagnosis not present

## 2024-07-02 DIAGNOSIS — E785 Hyperlipidemia, unspecified: Secondary | ICD-10-CM | POA: Diagnosis not present

## 2024-07-02 DIAGNOSIS — Z23 Encounter for immunization: Secondary | ICD-10-CM | POA: Diagnosis not present

## 2024-07-09 ENCOUNTER — Other Ambulatory Visit (HOSPITAL_COMMUNITY): Payer: Self-pay

## 2024-07-10 ENCOUNTER — Other Ambulatory Visit: Payer: Self-pay

## 2024-07-10 ENCOUNTER — Other Ambulatory Visit (HOSPITAL_COMMUNITY): Payer: Self-pay

## 2024-07-10 MED ORDER — TADALAFIL 5 MG PO TABS
5.0000 mg | ORAL_TABLET | Freq: Every day | ORAL | 11 refills | Status: AC | PRN
  Filled 2024-07-10: qty 30, 30d supply, fill #0
  Filled 2024-08-09: qty 30, 30d supply, fill #1
  Filled 2024-09-18: qty 30, 30d supply, fill #2
  Filled 2024-10-14: qty 30, 30d supply, fill #3
  Filled 2024-11-19: qty 30, 30d supply, fill #4

## 2024-08-09 ENCOUNTER — Other Ambulatory Visit (HOSPITAL_COMMUNITY): Payer: Self-pay

## 2024-09-05 DIAGNOSIS — Z23 Encounter for immunization: Secondary | ICD-10-CM | POA: Diagnosis not present

## 2024-09-18 ENCOUNTER — Other Ambulatory Visit (HOSPITAL_COMMUNITY): Payer: Self-pay

## 2024-09-18 ENCOUNTER — Other Ambulatory Visit: Payer: Self-pay

## 2024-09-26 ENCOUNTER — Other Ambulatory Visit (HOSPITAL_COMMUNITY): Payer: Self-pay

## 2024-09-26 DIAGNOSIS — R972 Elevated prostate specific antigen [PSA]: Secondary | ICD-10-CM | POA: Diagnosis not present

## 2024-09-26 DIAGNOSIS — N401 Enlarged prostate with lower urinary tract symptoms: Secondary | ICD-10-CM | POA: Diagnosis not present

## 2024-09-26 DIAGNOSIS — R3912 Poor urinary stream: Secondary | ICD-10-CM | POA: Diagnosis not present

## 2024-09-26 MED ORDER — LEVOFLOXACIN 750 MG PO TABS
ORAL_TABLET | ORAL | 0 refills | Status: AC
  Filled 2024-09-26: qty 1, 1d supply, fill #0

## 2024-10-15 ENCOUNTER — Other Ambulatory Visit (HOSPITAL_COMMUNITY): Payer: Self-pay

## 2024-11-19 ENCOUNTER — Other Ambulatory Visit (HOSPITAL_COMMUNITY): Payer: Self-pay

## 2024-11-19 MED ORDER — VALSARTAN 80 MG PO TABS
80.0000 mg | ORAL_TABLET | Freq: Every day | ORAL | 3 refills | Status: AC
  Filled 2024-11-19: qty 90, 90d supply, fill #0

## 2024-11-20 ENCOUNTER — Other Ambulatory Visit (HOSPITAL_COMMUNITY): Payer: Self-pay
